# Patient Record
Sex: Male | Born: 1995 | Race: Asian | Hispanic: Yes | Marital: Single | Smoking: Never smoker
Health system: Southern US, Community
[De-identification: ages and names within clinical notes are randomized; demographics above are authoritative.]

---
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
## 2015-06-02 IMAGING — CR DG CHEST 2V
2 series · 2 of 2 positions shown · non-contrast
Comparison: None.

CLINICAL DATA: Fever and chest pain

EXAM:
CHEST - 2 VIEW

[w chest pa]
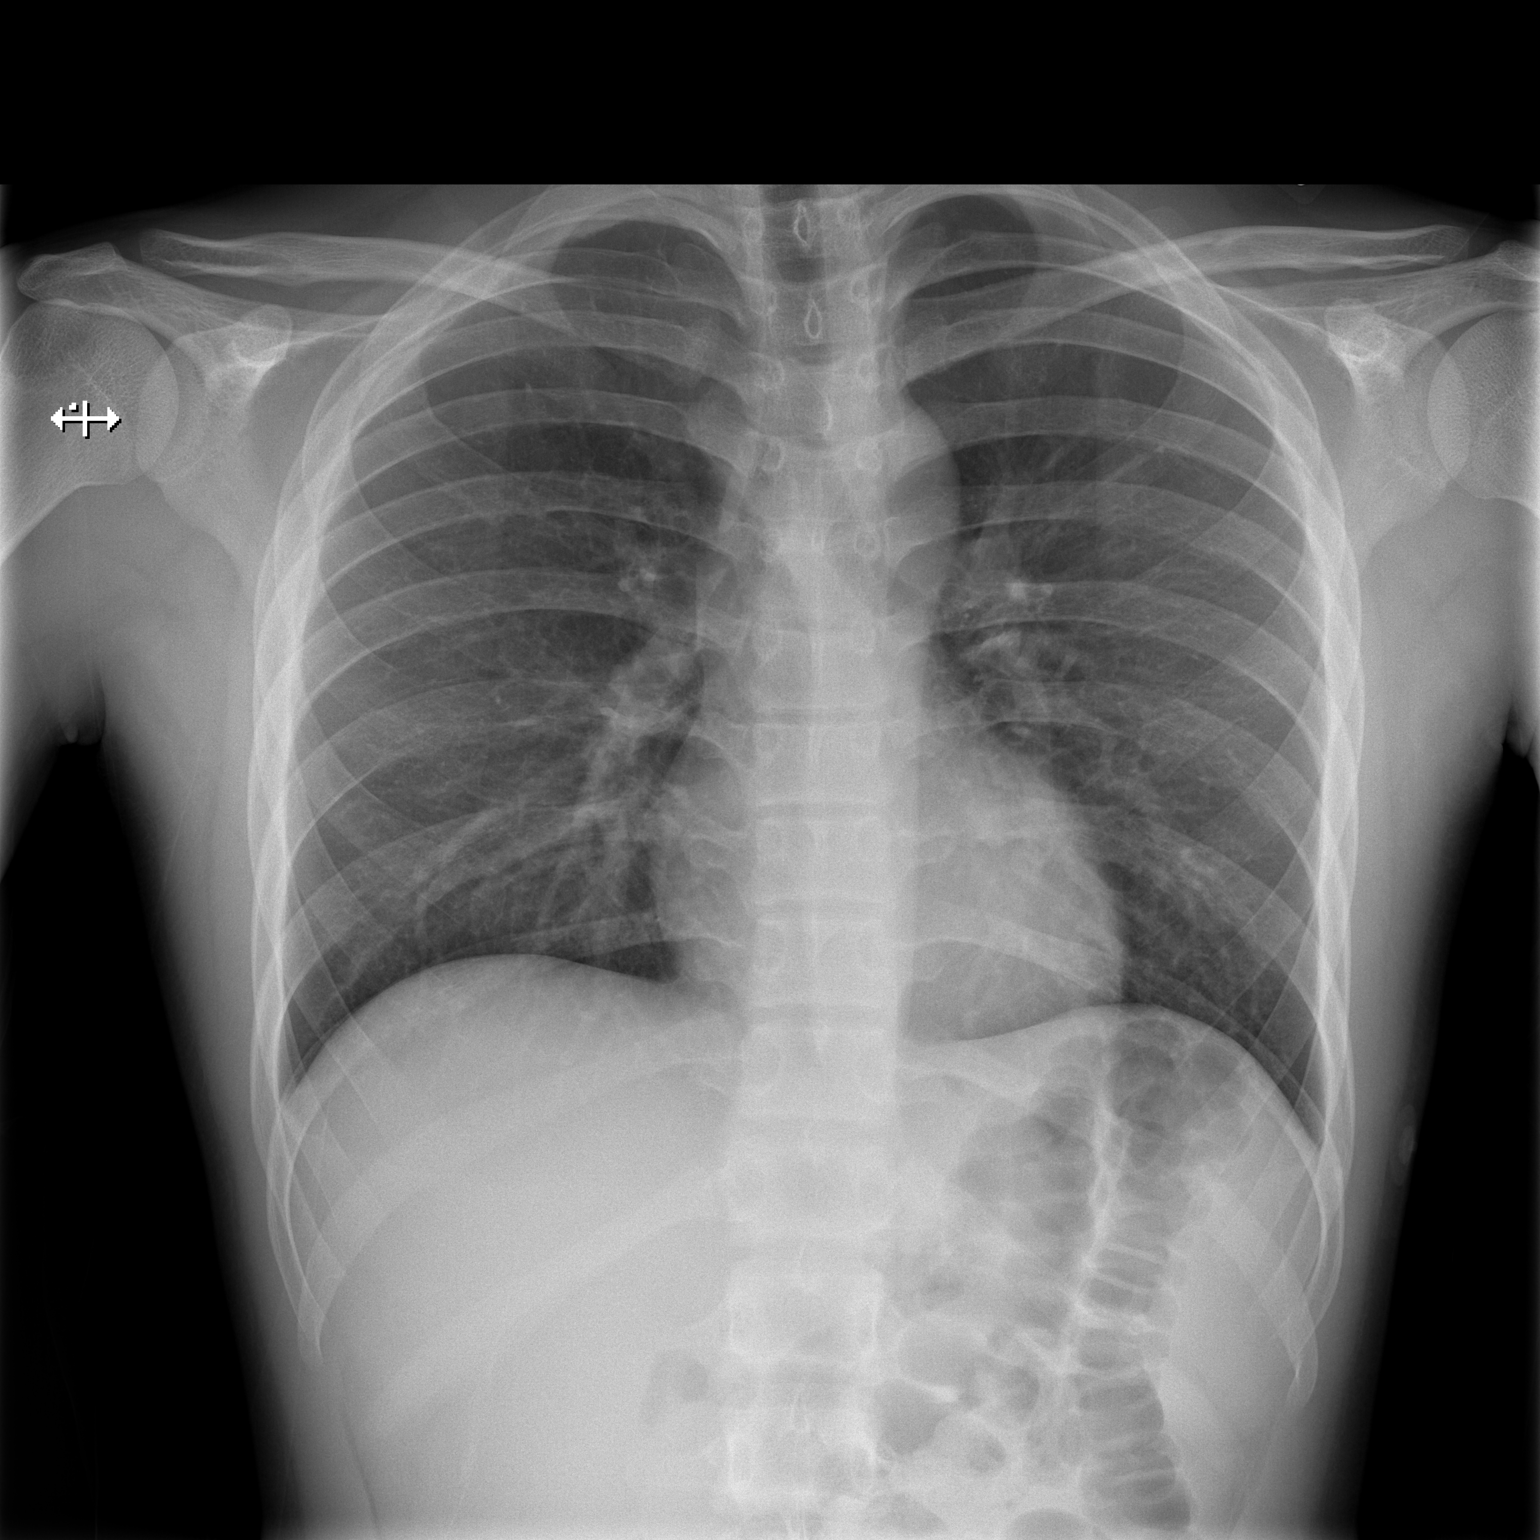

[w chest lat]
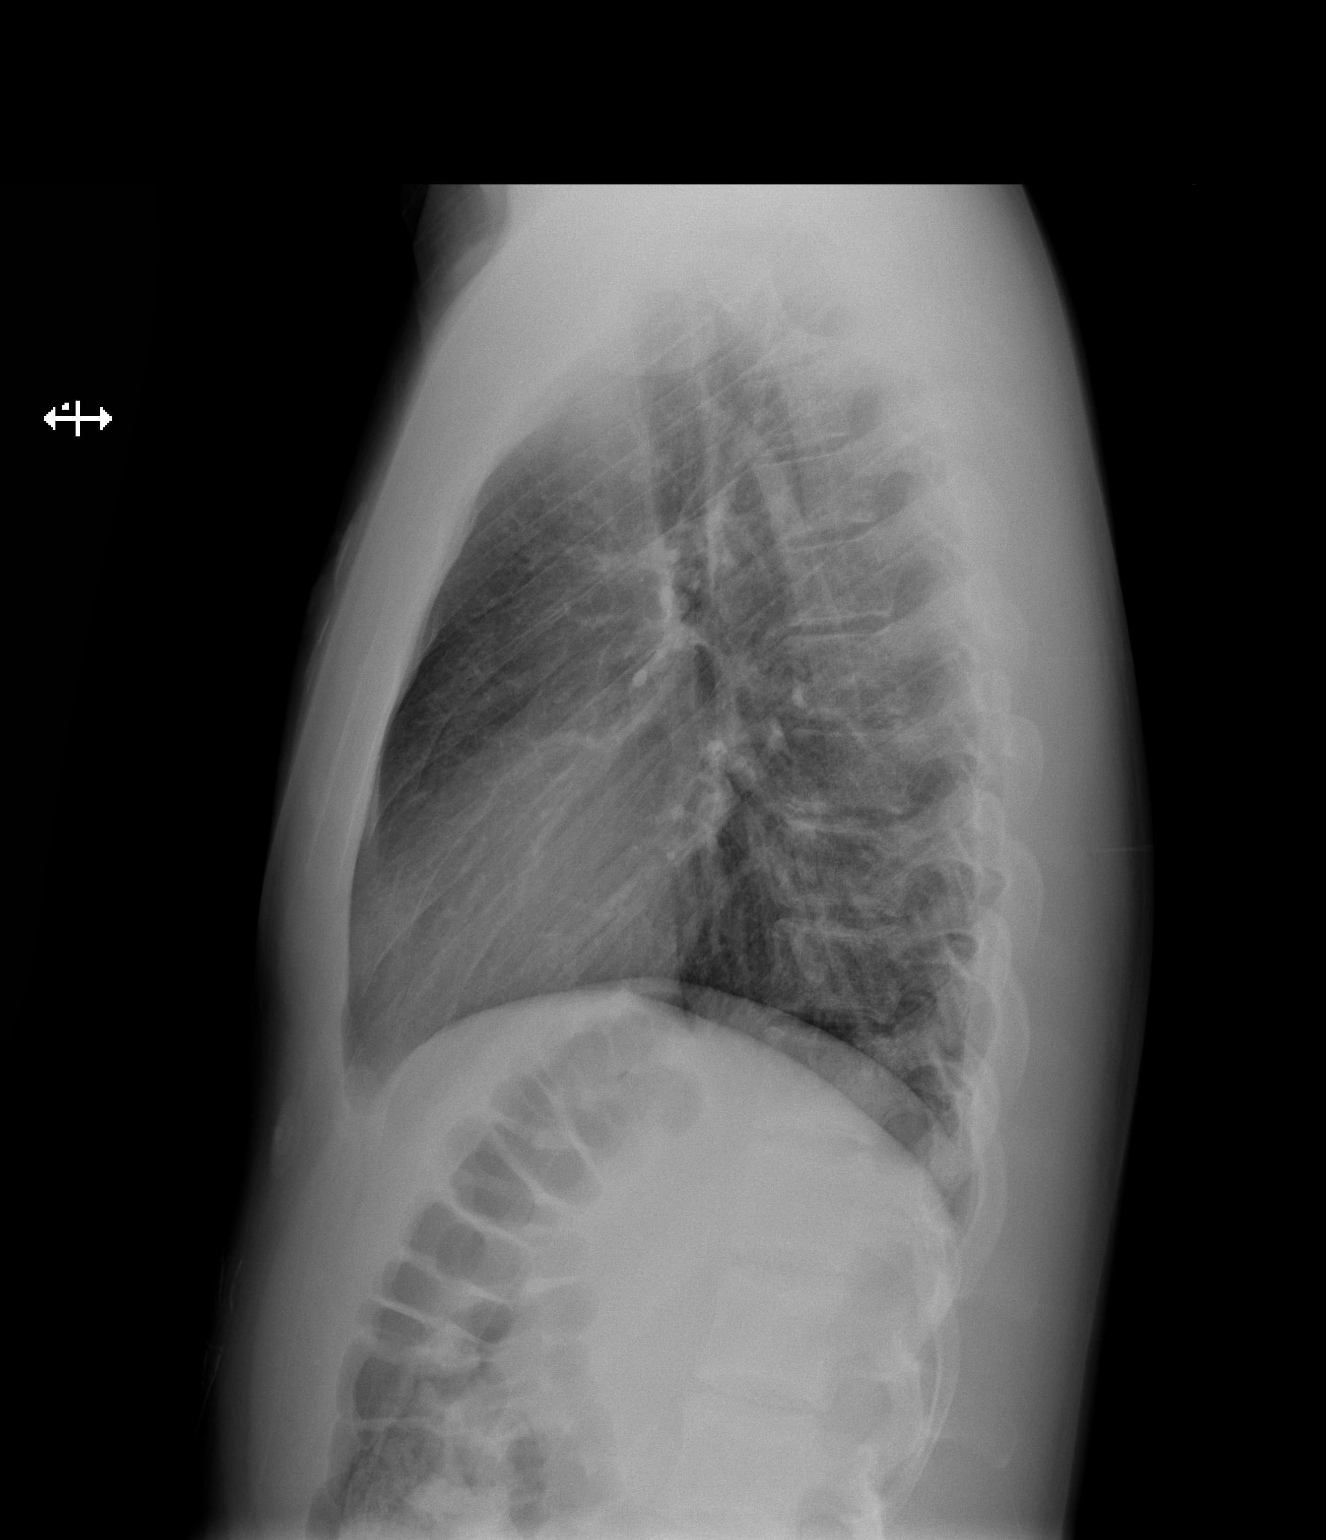

[2 of 2 positions shown; findings below may reference images not displayed]

FINDINGS: Normal heart size. Clear lungs. No pneumothorax. No pleural
effusion.
IMPRESSION: No active disease.
